# Patient Record
Sex: Female | Born: 1995 | Race: White | Hispanic: No | Marital: Single | Smoking: Never smoker
Health system: Southern US, Community
[De-identification: ages and names within clinical notes are randomized; demographics above are authoritative.]

## PROBLEM LIST (undated history)

## (undated) HISTORY — PX: WISDOM TOOTH EXTRACTION: SHX21

---

## 2016-06-16 ENCOUNTER — Emergency Department (HOSPITAL_BASED_OUTPATIENT_CLINIC_OR_DEPARTMENT_OTHER): Payer: BC Managed Care – PPO

## 2016-06-16 ENCOUNTER — Encounter (HOSPITAL_BASED_OUTPATIENT_CLINIC_OR_DEPARTMENT_OTHER): Payer: Self-pay | Admitting: Emergency Medicine

## 2016-06-16 ENCOUNTER — Emergency Department (HOSPITAL_BASED_OUTPATIENT_CLINIC_OR_DEPARTMENT_OTHER)
Admission: EM | Admit: 2016-06-16 | Discharge: 2016-06-16 | Disposition: A | Discharge status: Home / Self Care | Payer: BC Managed Care – PPO | Attending: Emergency Medicine | Admitting: Emergency Medicine

## 2016-06-16 DIAGNOSIS — J019 Acute sinusitis, unspecified: Secondary | ICD-10-CM | POA: Diagnosis not present

## 2016-06-16 DIAGNOSIS — R51 Headache: Secondary | ICD-10-CM | POA: Diagnosis present

## 2016-06-16 LAB — CBC WITH DIFFERENTIAL/PLATELET
BASOS ABS: 0 10*3/uL (ref 0.0–0.1)
BASOS PCT: 0 %
Eosinophils Absolute: 0 10*3/uL (ref 0.0–0.7)
Eosinophils Relative: 0 %
HEMATOCRIT: 39.2 % (ref 36.0–46.0)
Hemoglobin: 12.9 g/dL (ref 12.0–15.0)
LYMPHS PCT: 7 %
Lymphs Abs: 0.8 10*3/uL (ref 0.7–4.0)
MCH: 29.1 pg (ref 26.0–34.0)
MCHC: 32.9 g/dL (ref 30.0–36.0)
MCV: 88.5 fL (ref 78.0–100.0)
Monocytes Absolute: 0.8 10*3/uL (ref 0.1–1.0)
Monocytes Relative: 7 %
NEUTROS ABS: 10 10*3/uL — AB (ref 1.7–7.7)
Neutrophils Relative %: 86 %
PLATELETS: 397 10*3/uL (ref 150–400)
RBC: 4.43 MIL/uL (ref 3.87–5.11)
RDW: 13.3 % (ref 11.5–15.5)
WBC: 11.6 10*3/uL — AB (ref 4.0–10.5)

## 2016-06-16 LAB — BASIC METABOLIC PANEL
ANION GAP: 11 (ref 5–15)
BUN: 11 mg/dL (ref 6–20)
CALCIUM: 9 mg/dL (ref 8.9–10.3)
CO2: 24 mmol/L (ref 22–32)
Chloride: 101 mmol/L (ref 101–111)
Creatinine, Ser: 0.75 mg/dL (ref 0.44–1.00)
GLUCOSE: 110 mg/dL — AB (ref 65–99)
POTASSIUM: 4 mmol/L (ref 3.5–5.1)
Sodium: 136 mmol/L (ref 135–145)

## 2016-06-16 LAB — URINALYSIS, ROUTINE W REFLEX MICROSCOPIC
Bilirubin Urine: NEGATIVE
Glucose, UA: NEGATIVE mg/dL
Ketones, ur: NEGATIVE mg/dL
NITRITE: NEGATIVE
PROTEIN: NEGATIVE mg/dL
SPECIFIC GRAVITY, URINE: 1.019 (ref 1.005–1.030)
pH: 6.5 (ref 5.0–8.0)

## 2016-06-16 LAB — URINE MICROSCOPIC-ADD ON

## 2016-06-16 LAB — PREGNANCY, URINE: PREG TEST UR: NEGATIVE

## 2016-06-16 MED ORDER — DEXAMETHASONE SODIUM PHOSPHATE 10 MG/ML IJ SOLN
10.0000 mg | Freq: Once | INTRAMUSCULAR | Status: AC
  Administered 2016-06-16: 10 mg via INTRAVENOUS
  Filled 2016-06-16: qty 1

## 2016-06-16 MED ORDER — ACETAMINOPHEN 500 MG PO TABS: 1000.0000 mg | ORAL_TABLET | Freq: Once | ORAL | Status: DC

## 2016-06-16 MED ORDER — DEXAMETHASONE SODIUM PHOSPHATE 10 MG/ML IJ SOLN: 10.0000 mg | Freq: Once | INTRAMUSCULAR | Status: DC

## 2016-06-16 MED ORDER — ACETAMINOPHEN 325 MG PO TABS
650.0000 mg | ORAL_TABLET | Freq: Once | ORAL | Status: AC
  Administered 2016-06-16: 650 mg via ORAL
  Filled 2016-06-16: qty 2

## 2016-06-16 MED ORDER — SODIUM CHLORIDE 0.9 % IV BOLUS (SEPSIS)
1000.0000 mL | Freq: Once | INTRAVENOUS | Status: AC
  Administered 2016-06-16: 1000 mL via INTRAVENOUS

## 2016-06-16 MED ORDER — AMOXICILLIN-POT CLAVULANATE 875-125 MG PO TABS
1.0000 | ORAL_TABLET | Freq: Once | ORAL | Status: AC
  Administered 2016-06-16: 1 via ORAL
  Filled 2016-06-16: qty 1

## 2016-06-16 MED ORDER — KETOROLAC TROMETHAMINE 30 MG/ML IJ SOLN
30.0000 mg | Freq: Once | INTRAMUSCULAR | Status: AC
  Administered 2016-06-16: 30 mg via INTRAVENOUS
  Filled 2016-06-16: qty 1

## 2016-06-16 MED ORDER — OXYMETAZOLINE HCL 0.05 % NA SOLN: 1.0000 | Freq: Two times a day (BID) | NASAL | Status: DC

## 2016-06-16 MED ORDER — AMOXICILLIN-POT CLAVULANATE 875-125 MG PO TABS: 1.0000 | ORAL_TABLET | Freq: Two times a day (BID) | ORAL | Status: DC

## 2016-06-16 NOTE — ED Provider Notes (Signed)
CSN: [id number]     Arrival date & time 06/16/16  1328 History   First MD Initiated Contact with Patient 06/16/16 1354     Chief Complaint  Patient presents with  . Headache     (Consider location/radiation/quality/duration/timing/severity/associated sxs/prior Treatment) HPI   Patient is an 20 year old female with no pertinent past medical history presents the ED with complaint of headache, onset 2 weeks. Patient reports having an intermittent dull aching pain to her right temporal region for the past 2 weeks with intermittent sharp throbbing pain. She states she has been taking 800 mg ibuprofen at home with relief. She notes yesterday after returning home from a petting zoo proximally 5 hours later she began having a sharp intense throbbing headache to the right temporal region. Denies any aggravating or alleviating factors. Denies relief with ibuprofen or Excedrin at home. Endorses associated redness swelling and watering of the right eye, nasal congestion and photophobia. Patient reports history of migraines but notes her headache over the past 2 weeks is different from her typical migraines. Pt denies neck stiffness, visual changes, abdominal pain, N/V, urinary symptoms, numbness, tingling, weakness, seizures, syncope.  Patient reports she was seen in urgent care prior to arrival was reported having a fever of 100.3 and advised to come to the ED for further evaluation.  History reviewed. No pertinent past medical history. History reviewed. No pertinent past surgical history. History reviewed. No pertinent family history. Social History  Substance Use Topics  . Smoking status: Never Smoker   . Smokeless tobacco: None  . Alcohol Use: No   OB History    No data available     Review of Systems  Constitutional: Positive for fever.  HENT: Positive for congestion.   Eyes: Positive for photophobia and pain (right).  Neurological: Positive for headaches.  All other systems reviewed and are  negative.     Allergies  Review of patient's allergies indicates no known allergies.  Home Medications   Prior to Admission medications   Medication Sig Start Date End Date Taking? Authorizing Provider  amoxicillin-clavulanate (AUGMENTIN) 875-125 MG tablet Take 1 tablet by mouth every 12 (twelve) hours. 06/16/16   Barrett Henle, PA-C  oxymetazoline (AFRIN NASAL SPRAY) 0.05 % nasal spray Place 1 spray into both nostrils 2 (two) times daily. 06/16/16   Satira Sark Nadeau, PA-C   BP 116/59 mmHg  Pulse 89  Temp(Src) 102.7 F (39.3 C) (Oral)  Resp 20  Ht  (1.702 m)  Wt 53.524 kg  BMI 18.48 kg/m2  SpO2 100%  LMP 05/26/2016 Physical Exam  Constitutional: She is oriented to person, place, and time. She appears well-developed and well-nourished. No distress.  HENT:  Head: Normocephalic and atraumatic.  Right Ear: Tympanic membrane normal.  Left Ear: Tympanic membrane normal.  Nose: Rhinorrhea (right nare) present. Right sinus exhibits no maxillary sinus tenderness and no frontal sinus tenderness. Left sinus exhibits no maxillary sinus tenderness and no frontal sinus tenderness.  Mouth/Throat: Uvula is midline, oropharynx is clear and moist and mucous membranes are normal. No oropharyngeal exudate, posterior oropharyngeal edema, posterior oropharyngeal erythema or tonsillar abscesses.  Eyes: Conjunctivae and EOM are normal. Pupils are equal, round, and reactive to light. Right eye exhibits no discharge. Left eye exhibits no discharge. No scleral icterus.  Watering noted to right eye  Neck: Normal range of motion. Neck supple. No Kernig's sign noted.  Cardiovascular: Normal rate, regular rhythm, normal heart sounds and intact distal pulses.   Pulmonary/Chest: Effort normal and breath  sounds normal. No respiratory distress. She has no wheezes. She has no rales. She exhibits no tenderness.  Abdominal: Soft. Bowel sounds are normal. She exhibits no distension and no mass.  There is no tenderness. There is no rebound and no guarding.  Musculoskeletal: Normal range of motion. She exhibits no edema.  Lymphadenopathy:    She has no cervical adenopathy.  Neurological: She is alert and oriented to person, place, and time. She has normal strength. No cranial nerve deficit or sensory deficit. Coordination normal.  Skin: Skin is warm and dry. She is not diaphoretic.  Nursing note and vitals reviewed.   ED Course  Procedures (including critical care time) Labs Review Labs Reviewed  URINALYSIS, ROUTINE W REFLEX MICROSCOPIC (NOT AT [facility]) - Abnormal; Notable for the following:    APPearance CLOUDY (*)    Hgb urine dipstick SMALL (*)    Leukocytes, UA TRACE (*)    All other components within normal limits  CBC WITH DIFFERENTIAL/PLATELET - Abnormal; Notable for the following:    WBC 11.6 (*)    Neutro Abs 10.0 (*)    All other components within normal limits  BASIC METABOLIC PANEL - Abnormal; Notable for the following:    Glucose, Bld 110 (*)    All other components within normal limits  URINE MICROSCOPIC-ADD ON - Abnormal; Notable for the following:    Squamous Epithelial / LPF 6-30 (*)    Bacteria, UA MANY (*)    All other components within normal limits  PREGNANCY, URINE    Imaging Review Ct Head Wo Contrast  06/16/2016  CLINICAL DATA:  Headache.  Visual disturbance.  Eye swelling. EXAM: CT HEAD WITHOUT CONTRAST TECHNIQUE: Contiguous axial images were obtained from the base of the skull through the vertex without intravenous contrast. COMPARISON:  None. FINDINGS: No acute intracranial hemorrhage. No focal mass lesion. No CT evidence of acute infarction. No midline shift or mass effect. No hydrocephalus. Basilar cisterns are patent. There is complete opacification of the sphenoid sinus and partial opacification of the ethmoid air cells with complete opacification the posterior air cells while the antral air cells are patent. Frontal sinuses clear. Mild mucosal  thickening in the RIGHT maxillary sinus. IMPRESSION: 1. No acute intracranial findings. 2. Marked inflammation of the ethmoid and sphenoid sinuses. Electronically Signed   By: Genevive BiStewart  Edmunds M.D.   On: 06/16/2016 15:30   I have personally reviewed and evaluated these images and lab results as part of my medical decision-making.   EKG Interpretation None      MDM   Final diagnoses:  Acute sinusitis, recurrence not specified, unspecified location    Patient presents with headache with associated watering eyes, nasal congestion, photophobia and fever. Patient reports having waxing and waning headache over the past 2 weeks. Temp 100, remaining vital stable. Exam revealed rhinorrhea noted to the right knee air, mild watering of the right eye, no sinus tenderness, no neuro deficits, no meningeal signs. Remaining exam unremarkable. Patient given IV fluids and Tylenol. Patient placed on 2 L O2 via [place]. CBC 11.6. Remaining labs and urine unremarkable. Pregnancy negative. On reevaluation patient reports her headache has mildly improved. I was later notified by nurse that patient is complaining of slight worsening of her headache. Patient given IV Toradol and Decadron. CT head revealed marked inflammation of the ethmoid and sphenoid sinuses. I suspect patient's symptoms are likely due to bacterial sinusitis and do not suspect meningitis at this time. Patient given an initial dose of antibiotics in the  ED. Discussed results with patient and mother. On reevaluation patient reports her symptoms have significantly improved. Patient denies any neuro symptoms. Patient able to tolerate by mouth in the ED. Plan to discharge patient home with antibiotics, decongestion and advise to continue taking Tylenol and ibuprofen for her fever and headache. Advised patient to follow up with her PCP within the next 48 hours. Discussed return precautions with patient.    Satira Sarkicole Elizabeth New HolsteinNadeau, [place] JerseyPA-C 06/16/16 1721  Rolan BuccoMelanie  Belfi, MD 07/04/16 1105

## 2016-06-16 NOTE — ED Notes (Signed)
PA at bedside.

## 2016-06-16 NOTE — Discharge Instructions (Signed)
Take your medications as prescribed. I recommend continuing to take Tylenol and ibuprofen as prescribed over-the-counter for fever and headache, alternating between doses every 3 hours. You may also use nasal flushes and a humidifier.  Follow-up with your primary care provider in the next 48 hours if her symptoms have not improved. Please return to the Emergency Department if symptoms worsen or new onset of worsening fever, new/worsening headache, visual changes, lightheadedness, dizziness, neck stiffness, vomiting, difficulty breathing.

## 2016-06-16 NOTE — ED Notes (Addendum)
Pt reports headache to R temporal region intermittent x 2 weeks. Pt states the pain is normally managed by 4 ibuprofen but always comes back. Today, headache is persistent and pt states she cannot get relief. Pt also denies fever or visual disturbances. Pt states she began having congestion, eye swelling and malaise today. Pt denies SOB. Pt states she started taking new birth control pills 1 month ago.

## 2016-06-16 NOTE — ED Notes (Signed)
Pt and mother given d/c instructions as per chart. Rx x 2. Verbalizes understanding. No questions. 

## 2016-06-16 NOTE — ED Notes (Signed)
Patient reports that she has had Headache x 2 weeks. Reports that she thought this was part of a new Birth Control Pills.

## 2016-12-24 ENCOUNTER — Other Ambulatory Visit: Payer: Self-pay | Admitting: Otolaryngology

## 2016-12-24 DIAGNOSIS — J3489 Other specified disorders of nose and nasal sinuses: Secondary | ICD-10-CM

## 2016-12-27 ENCOUNTER — Ambulatory Visit (HOSPITAL_COMMUNITY)
Admission: RE | Admit: 2016-12-27 | Discharge: 2016-12-27 | Disposition: A | Discharge status: Home / Self Care | Payer: BC Managed Care – PPO | Source: Ambulatory Visit | Attending: Otolaryngology | Admitting: Otolaryngology

## 2016-12-27 DIAGNOSIS — J3489 Other specified disorders of nose and nasal sinuses: Secondary | ICD-10-CM | POA: Insufficient documentation

## 2016-12-27 MED ORDER — IOPAMIDOL (ISOVUE-300) INJECTION 61%
INTRAVENOUS | Status: AC
  Administered 2016-12-27: 75 mL via INTRAVENOUS
  Filled 2016-12-27: qty 75

## 2018-05-28 DIAGNOSIS — Z975 Presence of (intrauterine) contraceptive device: Secondary | ICD-10-CM | POA: Insufficient documentation

## 2018-08-10 IMAGING — CT CT MAXILLOFACIAL W/ CM
3 series · 16 of 47 positions shown, 19 images · IV contrast (agent unspecified)
Comparison: Head CT 06/16/2016

CLINICAL DATA: Persistent drainage and headache. Question lesion or
mass of the paranasal sinuses.

EXAM:
CT MAXILLOFACIAL WITH CONTRAST
TECHNIQUE: Multidetector CT imaging of the maxillofacial structures was
performed. Multiplanar CT image reconstructions were also generated.
A small metallic BB was placed on the right temple in order to
reliably differentiate right from left.

[Series 3: max soft · axial · 0.32mm/px · z∈[+1592,+1728]mm · 10 of 80 slices shown, 13 images]
[im 6/80  brain]
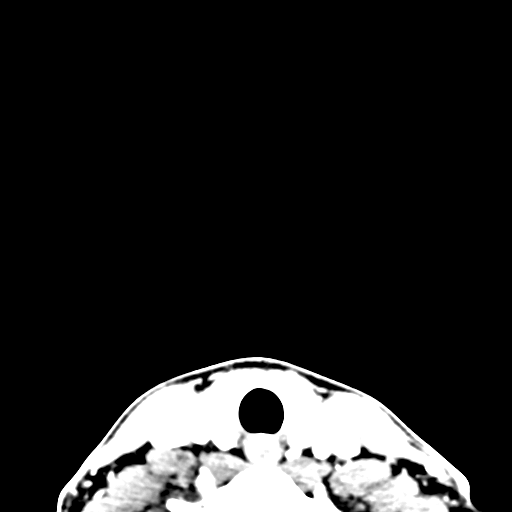
[im 6/80  bone]
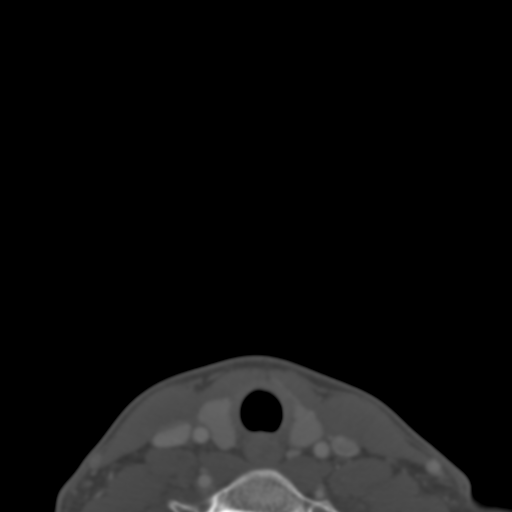
[im 14/80  bone]
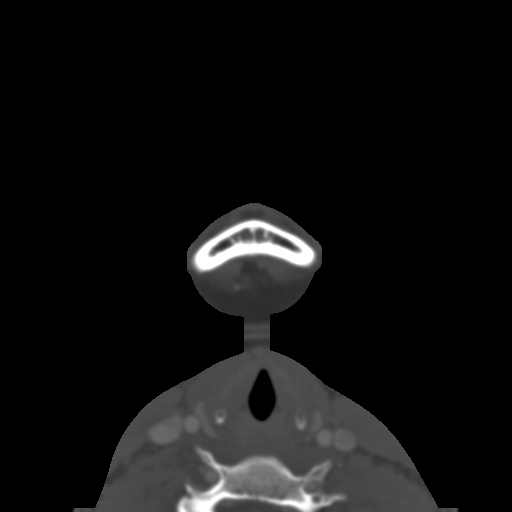
[im 22/80  bone]
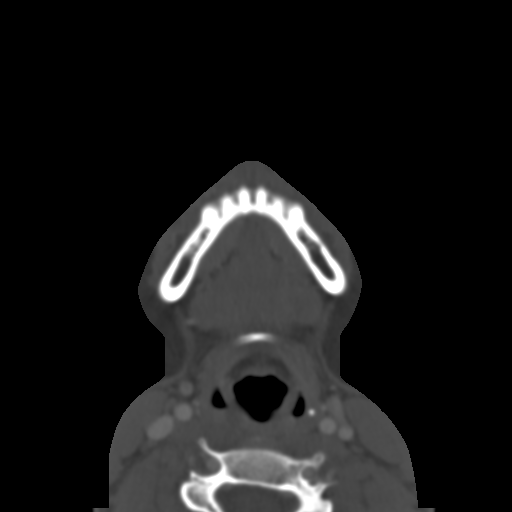
[im 28/80  bone]
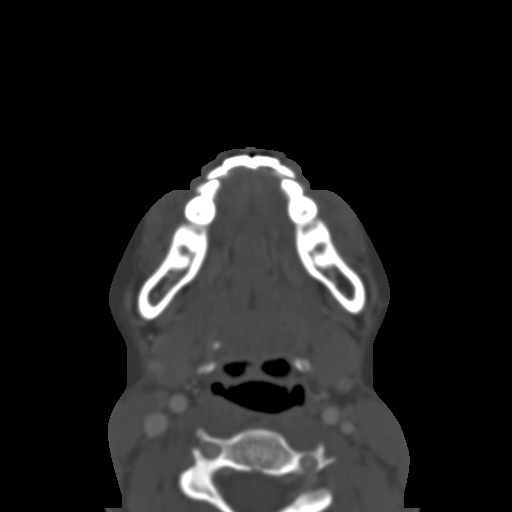
[im 36/80  brain]
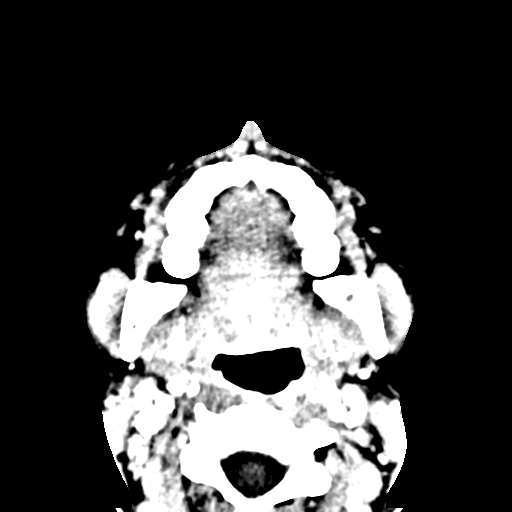
[im 36/80  bone]
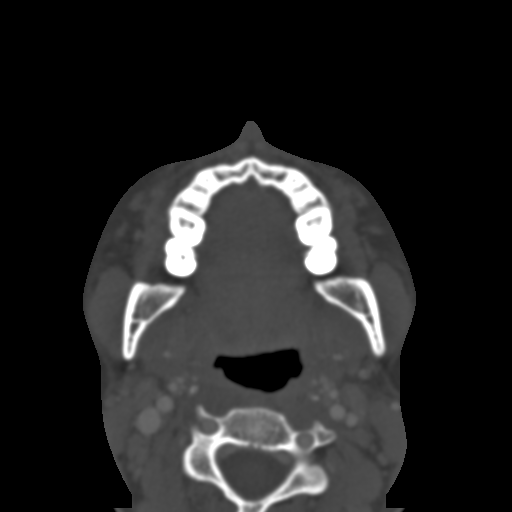
[im 44/80  bone]
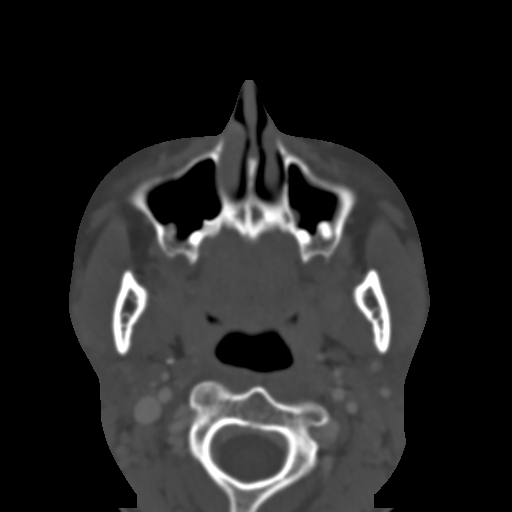
[im 52/80  bone]
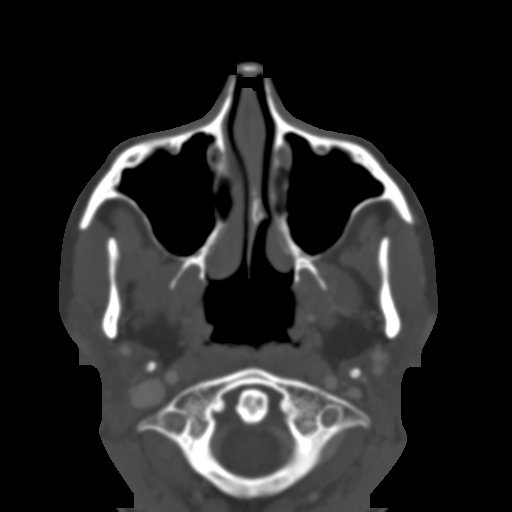
[im 60/80  bone]
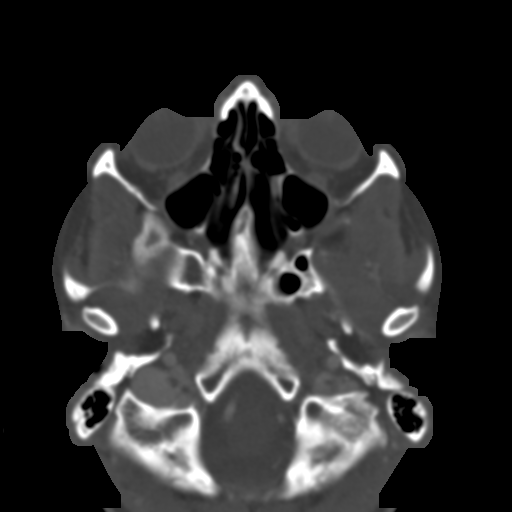
[im 66/80  brain]
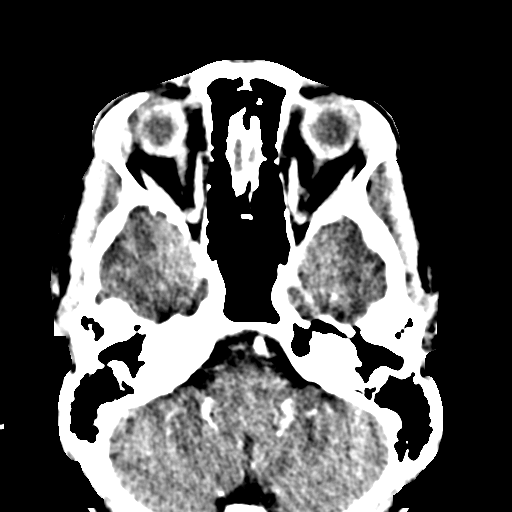
[im 66/80  bone]
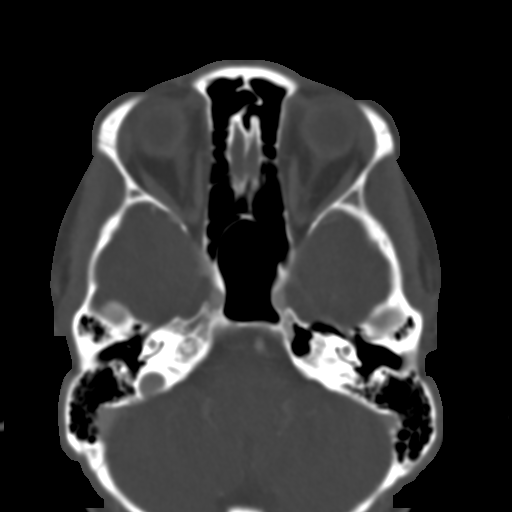
[im 74/80  bone]
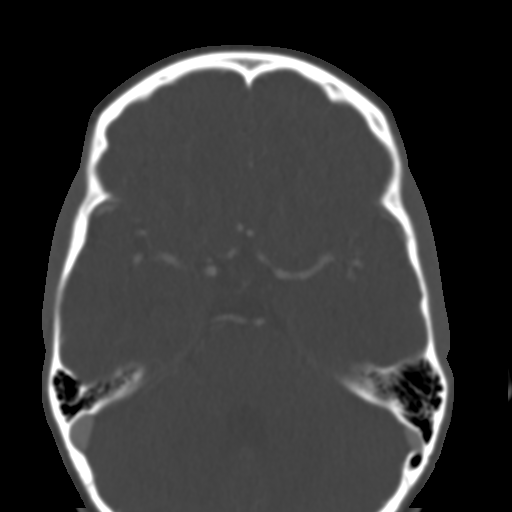

[Series 7: coronal soft · coronal · 0.32mm/px · 3 of 80 slices shown]
[im 27/80  bone]
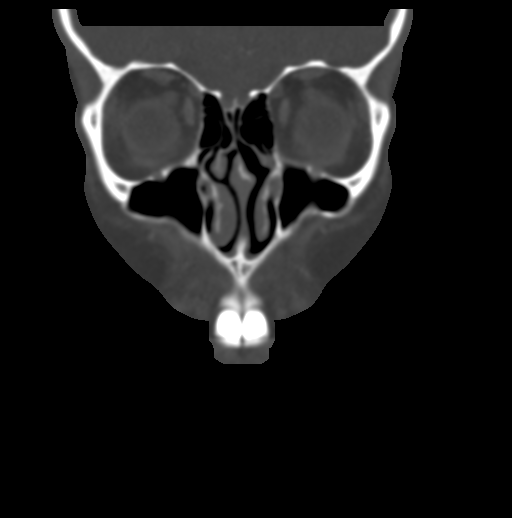
[im 36/80  bone]
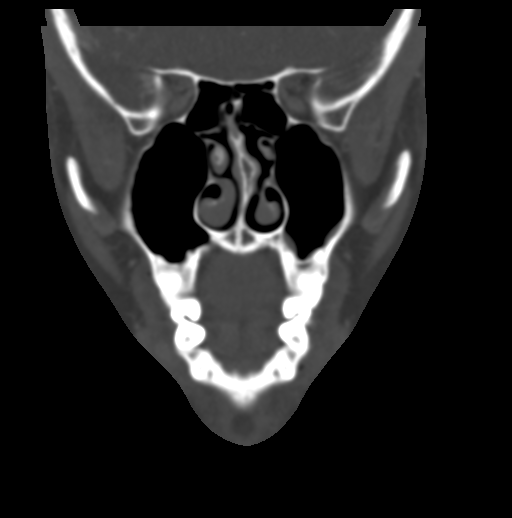
[im 44/80  bone]
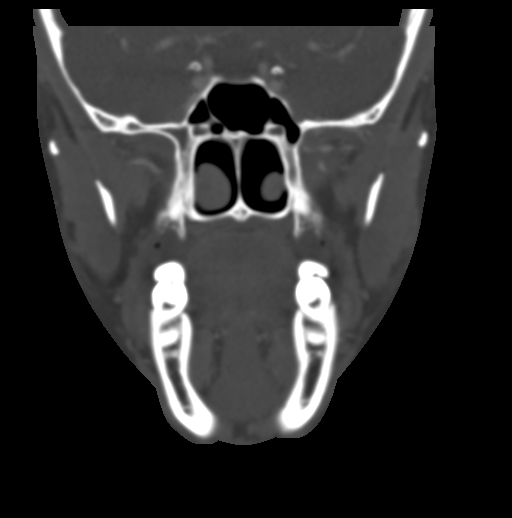

[Series 8: sagittal soft · sagittal · 0.32mm/px · 3 of 74 slices shown]
[im 25/74  bone]
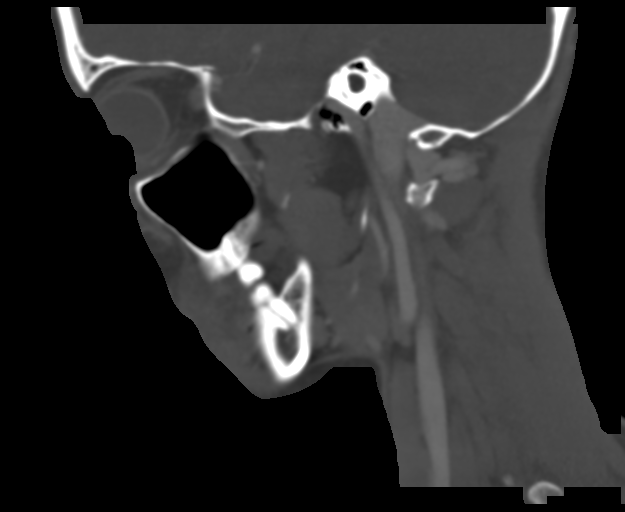
[im 37/74  bone]
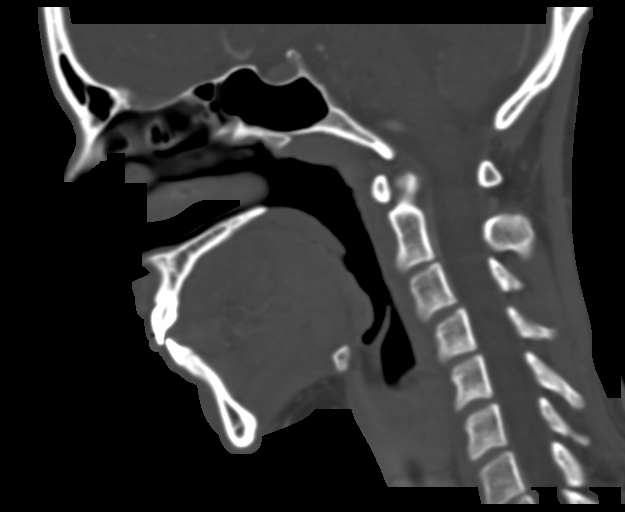
[im 49/74  bone]
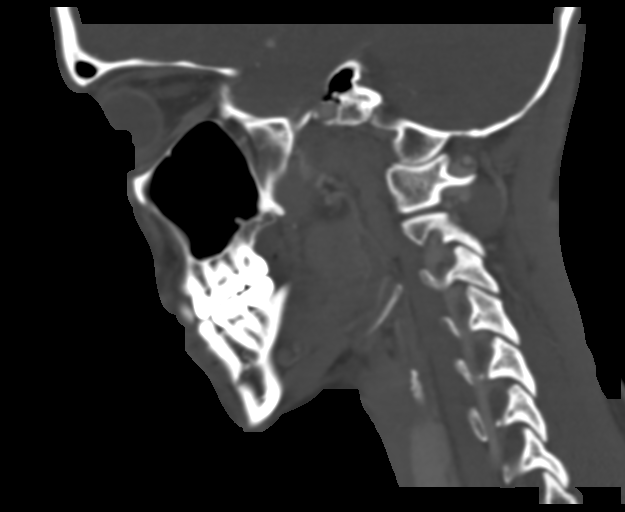

[16 of 47 positions shown; findings below may reference images not displayed]

FINDINGS: Osseous: No fracture deformity or destructive process.

Orbits: No mass or inflammation.

Sinuses: Ethmoid and sphenoid sinusitis seen previously is
completely resolved. Minimal mucosal thickening in the floor of the
left maxillary sinus, which may reflect small retention cysts.

Left maxillary infundibular stenosis due to uncinate morphology. No
infraorbital air cell. Clear sphenoid ethmoidal and frontal
ethmoidal recesses. Mild leftward nasal septal deviation.

Soft tissues: Negative for mass or inflammation.

Limited intracranial: Negative
IMPRESSION: 1. No visualized mass.
2. Resolved sphenoid and ethmoid sinusitis seen May 2016.

## 2019-09-13 ENCOUNTER — Encounter: Payer: Self-pay | Admitting: Rheumatology

## 2019-11-29 HISTORY — PX: WRIST FRACTURE SURGERY: SHX121

## 2019-12-01 NOTE — Progress Notes (Signed)
Virtual Visit via Video Note  I connected with Shannon Edwards on 12/03/19 at  8:15 AM EST by a video enabled telemedicine application and verified that I am speaking with the correct person using two identifiers.  Location: Patient: Home  Provider: Clinic  This service was conducted via virtual visit.  Both audio and visual tools were used.  The patient was located at home. I was located in my office.  Consent was obtained prior to the virtual visit and is aware of possible charges through their insurance for this visit.  The patient is an established patient.  Dr. Corliss Skains, MD conducted the virtual visit and Sherron Ales, PA-C acted as scribe during the service.  Office staff helped with scheduling follow up visits after the service was conducted.     I discussed the limitations of evaluation and management by telemedicine and the availability of in person appointments. The patient expressed understanding and agreed to proceed.  CC: History of Present Illness: Patient is a 23 year old female seen in consultation per request of her PCP.  According to patient recently her Shannon Edwards was diagnosed with systemic lupus erythematosus.  As patient was having some symptoms she got tested and her ANA came positive.  She states she has had photosensitivity since she was in the elementary school.  She states she gets rash nausea and vomiting when she is exposed to the sun for a long time.  She has seen dermatologist in the past and never had a skin biopsy.  She states she has had headaches since she was in high school and had a CT scan of her brain.  She was diagnosed with sinusitis and allergies.  She takes allergy medications.  She is also allergic to gluten.  She tries to avoid gluten.  She states allergy testing also showed multiple other allergies including mold.  She gives history of eczema and seborrheic dermatitis.  She states she also has rosacea.  He denies any history of joint pain or joint swelling.   There is no history of oral ulcers, nasal ulcers, malar rash, Raynaud's phenomenon.  She states she recently was involved in a snowboarding accident where she acquired right radius fracture and she has a metal plate now.  She is recovering from that gradually.  Review of Systems  Constitutional: Negative for fever and malaise/fatigue.  Eyes: Negative for photophobia, pain, discharge and redness.  Respiratory: Negative for cough, shortness of breath and wheezing.   Cardiovascular: Negative for chest pain and palpitations.  Gastrointestinal: Negative for blood in stool, constipation and diarrhea.  Genitourinary: Negative for dysuria.  Musculoskeletal: Negative for back pain, joint pain, myalgias and neck pain.       Denies morning stiffness   Skin: Negative for rash.       +Photosensitivity  Denies symptoms of Raynaud's  Neurological: Negative for dizziness and headaches.  Psychiatric/Behavioral: Positive for depression. The patient is nervous/anxious and has insomnia.       Observations/Objective: Physical Exam  Constitutional: She is oriented to person, place, and time and well-developed, well-nourished, and in no distress.  HENT:  Head: Normocephalic and atraumatic.  Eyes: Conjunctivae are normal.  Pulmonary/Chest: Effort normal.  Neurological: She is alert and oriented to person, place, and time.  Psychiatric: Mood, memory, affect and judgment normal.   Patient reports morning stiffness for 0  minutes.   Patient denies nocturnal pain.  Difficulty dressing/grooming: Denies Difficulty climbing stairs: Denies Difficulty getting out of chair: Denies Difficulty using hands for  taps, buttons, cutlery, and/or writing: Denies   Assessment and Plan: Diagnoses and all orders for this visit:  Positive ANA (antinuclear antibody)- Patient was tested for ANA and the test came positive for patient.  She gives history of photosensitivity, allergies and rosacea.  She gives history of  intermittent rash.  She also showed her some pictures on iPhone on her back with 2 small areas of rhythmic.  Her Shannon Edwards has been tested for lupus.  She is concerned that she may have lupus.  We will obtain[facility] labs today.  She will also need CBC, CMP with GFR, UA, sed rate, CK, lupus anticoagulant which are not part of [facility] labs. Comments: ANA+, RF<10  Family history of lupus erythematosus- Patient states that her Shannon Edwards was diagnosed with lupus but no treatment was started at this point. Comments: Shannon Edwards  Anxiety and depression- Patient has longstanding history of anxiety and depression.  She states she was recently switched from Zoloft to Lexapro.  She has been under a lot of stress.  History of allergic rhinitis- She is on Allegra.  Hx of fracture of radius Comments: Right-Snowboarding injury  Seborrheic dermatitis- Patient has seen dermatologist and has been diagnosed with seborrheic dermatitis and rosacea.    Follow Up Instructions: She will follow up in    I discussed the assessment and treatment plan with the patient. The patient was provided an opportunity to ask questions and all were answered. The patient agreed with the plan and demonstrated an understanding of the instructions.   The patient was advised to call back or seek an in-person evaluation if the symptoms worsen or if the condition fails to improve as anticipated.  I provided 45 minutes of non-face-to-face time during this encounter.   Bo Merino, MD

## 2019-12-03 ENCOUNTER — Other Ambulatory Visit: Payer: Self-pay | Admitting: *Deleted

## 2019-12-03 ENCOUNTER — Telehealth (INDEPENDENT_AMBULATORY_CARE_PROVIDER_SITE_OTHER): Payer: BC Managed Care – PPO | Admitting: Rheumatology

## 2019-12-03 ENCOUNTER — Encounter: Payer: Self-pay | Admitting: Rheumatology

## 2019-12-03 ENCOUNTER — Other Ambulatory Visit: Payer: Self-pay

## 2019-12-03 DIAGNOSIS — M2550 Pain in unspecified joint: Secondary | ICD-10-CM

## 2019-12-03 DIAGNOSIS — F329 Major depressive disorder, single episode, unspecified: Secondary | ICD-10-CM | POA: Diagnosis not present

## 2019-12-03 DIAGNOSIS — Z840 Family history of diseases of the skin and subcutaneous tissue: Secondary | ICD-10-CM

## 2019-12-03 DIAGNOSIS — Z8709 Personal history of other diseases of the respiratory system: Secondary | ICD-10-CM

## 2019-12-03 DIAGNOSIS — F419 Anxiety disorder, unspecified: Secondary | ICD-10-CM

## 2019-12-03 DIAGNOSIS — R768 Other specified abnormal immunological findings in serum: Secondary | ICD-10-CM | POA: Diagnosis not present

## 2019-12-03 DIAGNOSIS — F32A Depression, unspecified: Secondary | ICD-10-CM

## 2019-12-03 DIAGNOSIS — Z8781 Personal history of (healed) traumatic fracture: Secondary | ICD-10-CM

## 2019-12-03 DIAGNOSIS — R5383 Other fatigue: Secondary | ICD-10-CM

## 2019-12-03 DIAGNOSIS — L219 Seborrheic dermatitis, unspecified: Secondary | ICD-10-CM

## 2020-01-06 ENCOUNTER — Ambulatory Visit: Payer: BC Managed Care – PPO | Admitting: Rheumatology

## 2020-01-12 NOTE — Progress Notes (Signed)
Office Visit Note  Patient: Shannon Edwards             Date of Birth: 1996-02-27           MRN: [id number]             PCP: Maurice Small, MD Referring: Maurice Small, MD Visit Date: 01/18/2020 Occupation: @GUAROCC @  Subjective:  Discuss lab work  History of Present Illness: Shannon Edwards is a 24 y.o. female with history of positive ANA and fatigue. Patient reports that her symptoms have been stable since her initial visit.  She states she continues to have facial redness but denies any other rashes.  She reports photosensitivity. She denies any hair loss.  She has intermittent symptoms of Raynaud's in her fingers and toes.  She states she recently fractured her right wrist and has been followed by Dr. 21 after surgery.  She denies any other joint pain or joint swelling at this time.  She denies any oral or nasal ulcerations.  She has chronic mouth dryness but no eye dryness.  She denies any chest pain, palpations, or shortness of breath.  She states she is concerned about the positive ANA due to her mother's family history of lupus.  She states her mother was characterized as having low disease activity lupus and she is not on any treatment.   Activities of Daily Living:  Patient reports morning stiffness for 0 minutes.   Patient Denies nocturnal pain.  Difficulty dressing/grooming: Denies Difficulty climbing stairs: Denies Difficulty getting out of chair: Denies Difficulty using hands for taps, buttons, cutlery, and/or writing: Denies  Review of Systems  Constitutional: Negative for fatigue.  HENT: Positive for mouth dryness. Negative for mouth sores and nose dryness.   Eyes: Negative for itching and dryness.  Respiratory: Negative for shortness of breath, wheezing and difficulty breathing.   Cardiovascular: Negative for chest pain and palpitations.  Gastrointestinal: Negative for blood in stool, constipation and diarrhea.  Endocrine: Negative for increased urination.    Genitourinary: Negative for difficulty urinating and painful urination.  Musculoskeletal: Positive for arthralgias, joint pain and joint swelling. Negative for morning stiffness.  Skin: Positive for color change, rash and sensitivity to sunlight. Negative for hair loss.  Allergic/Immunologic: Negative for susceptible to infections.  Neurological: Positive for dizziness and headaches. Negative for numbness and weakness.  Psychiatric/Behavioral: Positive for sleep disturbance. Negative for confusion. The patient is nervous/anxious.     PMFS History:  There are no problems to display for this patient.   History reviewed. No pertinent past medical history.  Family History  Problem Relation Age of Onset  . Lupus Mother   . Healthy Father   . Healthy Brother    Past Surgical History:  Procedure Laterality Date  . WISDOM TOOTH EXTRACTION    . WRIST FRACTURE SURGERY Right 11/29/2019   Social History   Social History Narrative  . Not on file    There is no immunization history on file for this patient.   Objective: Vital Signs: BP 132/81 (BP Location: Right Arm, Patient Position: Sitting, Cuff Size: Normal)   Pulse (!) 103   Resp 11   Ht 5\' 6"  (1.676 m)   Wt 128 lb 12.8 oz (58.4 kg)   BMI 20.79 kg/m    Physical Exam Vitals and nursing note reviewed.  Constitutional:      Appearance: She is well-developed.  HENT:     Head: Normocephalic and atraumatic.  Eyes:     Conjunctiva/sclera: Conjunctivae  normal.  Cardiovascular:     Rate and Rhythm: Normal rate and regular rhythm.     Heart sounds: Normal heart sounds.  Pulmonary:     Effort: Pulmonary effort is normal.     Breath sounds: Normal breath sounds.  Abdominal:     General: Bowel sounds are normal.     Palpations: Abdomen is soft.  Musculoskeletal:     Cervical back: Normal range of motion.  Lymphadenopathy:     Cervical: No cervical adenopathy.  Skin:    General: Skin is warm and dry.     Capillary Refill:  Capillary refill takes 2 to 3 seconds.     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   No digital ulcerations.  No telangiectasias, skin thickening, or nailbed changes.     Neurological:     Mental Status: She is alert and oriented to person, place, and time.  Psychiatric:        Behavior: Behavior normal.      Musculoskeletal Exam: C-spine, thoracic spine, and lumbar spine good ROM.  Shoulder joints, elbow joints, wrist joints, MCPs, PIPs, and DIPs good ROM with no synovitis.  Complete fist formation bilaterally.  Hip joints, knee joints, ankle joints, MTPs, PIPs, and DIPs good ROM with no synovitis.  No warmth or effusion of knee joints.  No tenderness or inflammation of ankle joints.   CDAI Exam: CDAI Score: -- Patient Global: --; Provider Global: -- Swollen: --; Tender: -- Joint Exam 01/18/2020   No joint exam has been documented for this visit   There is currently no information documented on the homunculus. Go to the Rheumatology activity and complete the homunculus joint exam.  Investigation: No additional findings.  Imaging: No results found.  Recent Labs: Lab Results  Component Value Date   WBC 11.6 (H) 06/16/2016   HGB 12.9 06/16/2016   PLT 397 06/16/2016   NA 136 06/16/2016   K 4.0 06/16/2016   CL 101 06/16/2016   CO2 24 06/16/2016   GLUCOSE 110 (H) 06/16/2016   BUN 11 06/16/2016   CREATININE 0.75 06/16/2016   CALCIUM 9.0 06/16/2016   GFRAA >60 06/16/2016   December 21, 2019 AVISE lupus index -2.9, ANA 1:640 Centromere, centromere ab+, rest of ENA negative, Jo 1 -, anticardiolipin negative, beta-2 negative, RF negative, anti-CCP negative, antithyroglobulin negative, antithyroid peroxidase negative, antihistone negative. Speciality Comments: No specialty comments available.  Procedures:  No procedures performed Allergies: Patient has no known allergies.      Assessment / Plan:     Visit Diagnoses: Positive ANA (antinuclear antibody) - AVISE lupus index  -2.9, ANA 1:640 Centromere, Centromere Ab+, rest of work up negative: We reviewed AVISE lab results with the patient today.  All questions were addressed.  We discussed the significance of a positive ANA and positive centromere antibody.  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  Capillary refill 2-3 seconds.  No digital ulcerations or signs of gangrene noted. No nailbed changes, telangiectasias, or skin thickening noted.  No malar rash.  No dysphagia. She has no other clinical features of autoimmune disease at this time.  She has no joint pain, synovitis, oral/nasal ulcerations, severe sicca symptoms, enlarged lymph nodes, chest pain, or shortness of breath.  We discussed symptoms to monitor for.  She will notify us if she develops any new or worsening symptoms.  She will follow up in 6 months and we will recheck labs at that visit.  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  Family history of lupus erythematosus - Mother  Other fatigue: Stable  Hx of fracture of radius - She fractured her radius and had a rod placed on 11/29/19.  She continues to have limited ROM and diffuse inflammation.  She is following up with Dr. Jimmey Ralph in [place], [place].   Other medical conditions are listed as follows:   Seborrheic dermatitis  Anxiety and depression  History of allergic rhinitis  Orders: No orders of the defined types were placed in this encounter.  No orders of the defined types were placed in this encounter.   Face-to-face time spent with patient was 30 minutes. Greater than 50% of time was spent in counseling and coordination of care.  Follow-Up Instructions: Return for Positive ANA.   Gearldine Bienenstock, PA-C   I examined and evaluated the patient with Sherron Ales PA.  Patient has positive ANA significant titer with centromere pattern.  All other autoimmune antibodies were negative.  Her complements were  normal.  There is no evidence of sclerodactyly, telangiectasias or periungual blood.  She has mild Raynaud's symptoms.  We will monitor her over time.  She is moving to [place] in September.  I will evaluate her in August and then on yearly basis.  She has been advised to contact us in case she develops any new symptoms.  She had mild livedo reticularis on examination.  The plan of care was discussed as noted above.  Pollyann Savoy, MD  Note - This record has been created using Animal nutritionist.  Chart creation errors have been sought, but may not always  have been located. Such creation errors do not reflect on  the standard of medical care.

## 2020-01-18 ENCOUNTER — Encounter: Payer: Self-pay | Admitting: Rheumatology

## 2020-01-18 ENCOUNTER — Other Ambulatory Visit: Payer: Self-pay

## 2020-01-18 ENCOUNTER — Ambulatory Visit (INDEPENDENT_AMBULATORY_CARE_PROVIDER_SITE_OTHER): Payer: BC Managed Care – PPO | Admitting: Rheumatology

## 2020-01-18 VITALS — BP 132/81 | HR 103 | Resp 11 | Ht 66.0 in | Wt 128.8 lb

## 2020-01-18 DIAGNOSIS — F329 Major depressive disorder, single episode, unspecified: Secondary | ICD-10-CM

## 2020-01-18 DIAGNOSIS — Z8781 Personal history of (healed) traumatic fracture: Secondary | ICD-10-CM | POA: Diagnosis not present

## 2020-01-18 DIAGNOSIS — Z840 Family history of diseases of the skin and subcutaneous tissue: Secondary | ICD-10-CM | POA: Diagnosis not present

## 2020-01-18 DIAGNOSIS — R231 Pallor: Secondary | ICD-10-CM

## 2020-01-18 DIAGNOSIS — Z8709 Personal history of other diseases of the respiratory system: Secondary | ICD-10-CM

## 2020-01-18 DIAGNOSIS — R768 Other specified abnormal immunological findings in serum: Secondary | ICD-10-CM

## 2020-01-18 DIAGNOSIS — F419 Anxiety disorder, unspecified: Secondary | ICD-10-CM

## 2020-01-18 DIAGNOSIS — F32A Depression, unspecified: Secondary | ICD-10-CM

## 2020-01-18 DIAGNOSIS — L219 Seborrheic dermatitis, unspecified: Secondary | ICD-10-CM

## 2020-01-18 DIAGNOSIS — R5383 Other fatigue: Secondary | ICD-10-CM | POA: Diagnosis not present

## 2020-01-18 DIAGNOSIS — I7300 Raynaud's syndrome without gangrene: Secondary | ICD-10-CM

## 2020-01-18 NOTE — Patient Instructions (Signed)
Raynaud's syndrome

## 2020-07-18 NOTE — Progress Notes (Deleted)
   Office Visit Note  Patient: Shannon Edwards             Date of Birth: [birth year]           MRN: [id number]             PCP: Maurice Small, MD Referring: Maurice Small, MD Visit Date: 08/01/2020 Occupation: @GUAROCC @  Subjective:  No chief complaint on file.   History of Present Illness: Shannon Edwards is a 24 y.o. female ***   Activities of Daily Living:  Patient reports morning stiffness for *** {minute/hour:19697}.   Patient {ACTIONS;DENIES/REPORTS:21021675::"Denies"} nocturnal pain.  Difficulty dressing/grooming: {ACTIONS;DENIES/REPORTS:21021675::"Denies"} Difficulty climbing stairs: {ACTIONS;DENIES/REPORTS:21021675::"Denies"} Difficulty getting out of chair: {ACTIONS;DENIES/REPORTS:21021675::"Denies"} Difficulty using hands for taps, buttons, cutlery, and/or writing: {ACTIONS;DENIES/REPORTS:21021675::"Denies"}  No Rheumatology ROS completed.   PMFS History:  There are no problems to display for this patient.   No past medical history on file.  Family History  Problem Relation Age of Onset  . Lupus Mother   . Healthy Father   . Healthy Brother    Past Surgical History:  Procedure Laterality Date  . WISDOM TOOTH EXTRACTION    . WRIST FRACTURE SURGERY Right 11/29/2019   Social History   Social History Narrative  . Not on file    There is no immunization history on file for this patient.   Objective: Vital Signs: There were no vitals taken for this visit.   Physical Exam   Musculoskeletal Exam: ***  CDAI Exam: CDAI Score: -- Patient Global: --; Provider Global: -- Swollen: --; Tender: -- Joint Exam 08/01/2020   No joint exam has been documented for this visit   There is currently no information documented on the homunculus. Go to the Rheumatology activity and complete the homunculus joint exam.  Investigation: No additional findings.  Imaging: No results found.  Recent Labs: Lab Results  Component Value Date   WBC 11.6 (H) 06/16/2016    HGB 12.9 06/16/2016   PLT 397 06/16/2016   NA 136 06/16/2016   K 4.0 06/16/2016   CL 101 06/16/2016   CO2 24 06/16/2016   GLUCOSE 110 (H) 06/16/2016   BUN 11 06/16/2016   CREATININE 0.75 06/16/2016   CALCIUM 9.0 06/16/2016   GFRAA >60 06/16/2016    Speciality Comments: No specialty comments available.  Procedures:  No procedures performed Allergies: Patient has no known allergies.   Assessment / Plan:     Visit Diagnoses: No diagnosis found.  Orders: No orders of the defined types were placed in this encounter.  No orders of the defined types were placed in this encounter.   Face-to-face time spent with patient was *** minutes. Greater than 50% of time was spent in counseling and coordination of care.  Follow-Up Instructions: No follow-ups on file.   06/18/2016, CMA  Note - This record has been created using Ellen Henri.  Chart creation errors have been sought, but may not always  have been located. Such creation errors do not reflect on  the standard of medical care.

## 2020-08-01 ENCOUNTER — Ambulatory Visit: Payer: BC Managed Care – PPO | Admitting: Rheumatology

## 2021-07-24 ENCOUNTER — Other Ambulatory Visit: Payer: Self-pay

## 2021-07-24 ENCOUNTER — Ambulatory Visit: Payer: BC Managed Care – PPO | Admitting: Podiatry

## 2021-07-24 ENCOUNTER — Encounter: Payer: Self-pay | Admitting: Podiatry

## 2021-07-24 DIAGNOSIS — B070 Plantar wart: Secondary | ICD-10-CM

## 2021-07-24 DIAGNOSIS — F900 Attention-deficit hyperactivity disorder, predominantly inattentive type: Secondary | ICD-10-CM | POA: Insufficient documentation

## 2021-07-24 DIAGNOSIS — F418 Other specified anxiety disorders: Secondary | ICD-10-CM | POA: Insufficient documentation

## 2021-07-24 MED ORDER — FLUOROURACIL 5 % EX CREA: TOPICAL_CREAM | Freq: Two times a day (BID) | CUTANEOUS | 1 refills | Status: AC

## 2021-07-24 NOTE — Progress Notes (Signed)
  Subjective:  Patient ID: Shannon Edwards, female    DOB: 1996/06/19,  MRN: [id number] HPI Chief Complaint  Patient presents with   Foot Pain    Plantar forefoot and plantar hallux right - multiple callused lesions on toe and one large lesion forefoot x 3 months, pain with walking, tried corn pads and removers-no help   New Patient (Initial Visit)    25 y.o. female presents with the above complaint.   ROS: Denies fever chills nausea vomiting muscle aches pains calf pain back pain chest pain shortness of breath.  No past medical history on file. Past Surgical History:  Procedure Laterality Date   WISDOM TOOTH EXTRACTION     WRIST FRACTURE SURGERY Right 11/29/2019    Current Outpatient Medications:    fluorouracil (EFUDEX) 5 % cream, Apply topically 2 (two) times daily., Disp: 40 g, Rfl: 1   levonorgestrel (KYLEENA) 19.5 MG IUD, Kyleena 17.5 mcg/24 hrs (65yrs) 19.5mg  intrauterine device, Disp: , Rfl:    VITAMIN D PO, Take by mouth., Disp: , Rfl:    amphetamine-dextroamphetamine (ADDERALL XR) 10 MG 24 hr capsule, Take 10 mg by mouth daily., Disp: , Rfl:    citalopram (CELEXA) 20 MG tablet, Take 20 mg by mouth daily., Disp: , Rfl:    EPINEPHrine 0.3 mg/0.3 mL IJ SOAJ injection, Auvi-Q 0.3 mg/0.3 mL injection, auto-injector  INJECT AS NEEDED FOR SEVERE ALLERGIC REACTION INCLUDING ANAPHYLAXIS AS DIRECTED, Disp: , Rfl:    levocetirizine (XYZAL ALLERGY 24HR) 5 MG tablet, Xyzal 5 mg tablet  Take 1 tablet every day by oral route., Disp: , Rfl:   No Known Allergies Review of Systems Objective:  There were no vitals filed for this visit.  General: Well developed, nourished, in no acute distress, alert and oriented x3   Dermatological: Skin is warm, dry and supple bilateral. Nails x 10 are well maintained; remaining integument appears unremarkable at this time. There are no open sores, no preulcerative lesions, no rash or signs of infection present.  Verrucoid lesion just proximal to the  metatarsal head central foot measuring 0.7 cm in total diameter with verrucoid characteristics consistent with thrombosed capillaries and skin lines circumventing the lesion.  She also has 2 smaller lesions on the tuft of the fourth toe right foot.  Vascular: Dorsalis Pedis artery and Posterior Tibial artery pedal pulses are 2/4 bilateral with immedate capillary fill time. Pedal hair growth present. No varicosities and no lower extremity edema present bilateral.   Neruologic: Grossly intact via light touch bilateral. Vibratory intact via tuning fork bilateral. Protective threshold with Semmes Wienstein monofilament intact to all pedal sites bilateral. Patellar and Achilles deep tendon reflexes 2+ bilateral. No Babinski or clonus noted bilateral.   Musculoskeletal: No gross boney pedal deformities bilateral. No pain, crepitus, or limitation noted with foot and ankle range of motion bilateral. Muscular strength 5/5 in all groups tested bilateral.  Gait: Unassisted, Nonantalgic.    Radiographs:  None taken  Assessment & Plan:   Assessment: Verrucoid lesions plantar aspect right foot and fourth toe  Plan: Chemical destruction of lesions today and placed her on Efudex cream for the right foot.  We will follow-up with her in 1 month to 6 weeks for reevaluation at which time we can consider numbing these up and excising them if she wants to do that.     Fabien Travelstead T. [place], [place]

## 2021-08-15 ENCOUNTER — Ambulatory Visit: Payer: BC Managed Care – PPO | Admitting: Physician Assistant

## 2021-08-28 ENCOUNTER — Ambulatory Visit: Payer: BC Managed Care – PPO | Admitting: Podiatry

## 2021-09-20 ENCOUNTER — Ambulatory Visit (INDEPENDENT_AMBULATORY_CARE_PROVIDER_SITE_OTHER): Payer: BC Managed Care – PPO | Admitting: Podiatry

## 2021-09-20 ENCOUNTER — Other Ambulatory Visit: Payer: Self-pay

## 2021-09-20 DIAGNOSIS — B070 Plantar wart: Secondary | ICD-10-CM

## 2021-09-21 NOTE — Progress Notes (Signed)
She presents today for follow-up of warts plantar aspect of her right foot.  She states that they turned dark and really are no longer painful.  Objective: Vital signs are stable she is alert and oriented x3.  Warts plantar aspect of the right foot demonstrate scabbed appearances.  These were debrided today and demonstrated no further wart at those sites.  Though there does appear to be a few small warts scattered about and I demonstrated these for her today.  Assessment: Well-healing verruca plantaris right foot.  A few warts are remaining.  Plan: At this point she will continue the application of the Efudex cream to the new warts.  I will follow-up with her in a few weeks for reevaluation

## 2021-10-31 ENCOUNTER — Other Ambulatory Visit: Payer: Self-pay | Admitting: Obstetrics and Gynecology

## 2021-10-31 DIAGNOSIS — N6310 Unspecified lump in the right breast, unspecified quadrant: Secondary | ICD-10-CM

## 2021-11-14 ENCOUNTER — Other Ambulatory Visit: Payer: Self-pay

## 2021-11-14 ENCOUNTER — Ambulatory Visit
Admission: RE | Admit: 2021-11-14 | Discharge: 2021-11-14 | Disposition: A | Discharge status: Home / Self Care | Payer: BC Managed Care – PPO | Source: Ambulatory Visit | Attending: Obstetrics and Gynecology | Admitting: Obstetrics and Gynecology

## 2021-11-14 DIAGNOSIS — N6310 Unspecified lump in the right breast, unspecified quadrant: Secondary | ICD-10-CM

## 2023-06-28 IMAGING — US US BREAST*R* LIMITED INC AXILLA
1 series · 7 of 7 positions shown · non-contrast
Comparison: None.

CLINICAL DATA: Palpable lump in the right breast.

EXAM:
ULTRASOUND OF THE RIGHT BREAST

[Series 1: us breast*right* limited inc axilla · 0.06mm/px · 7 of 7 slices shown]
[im 1/7]
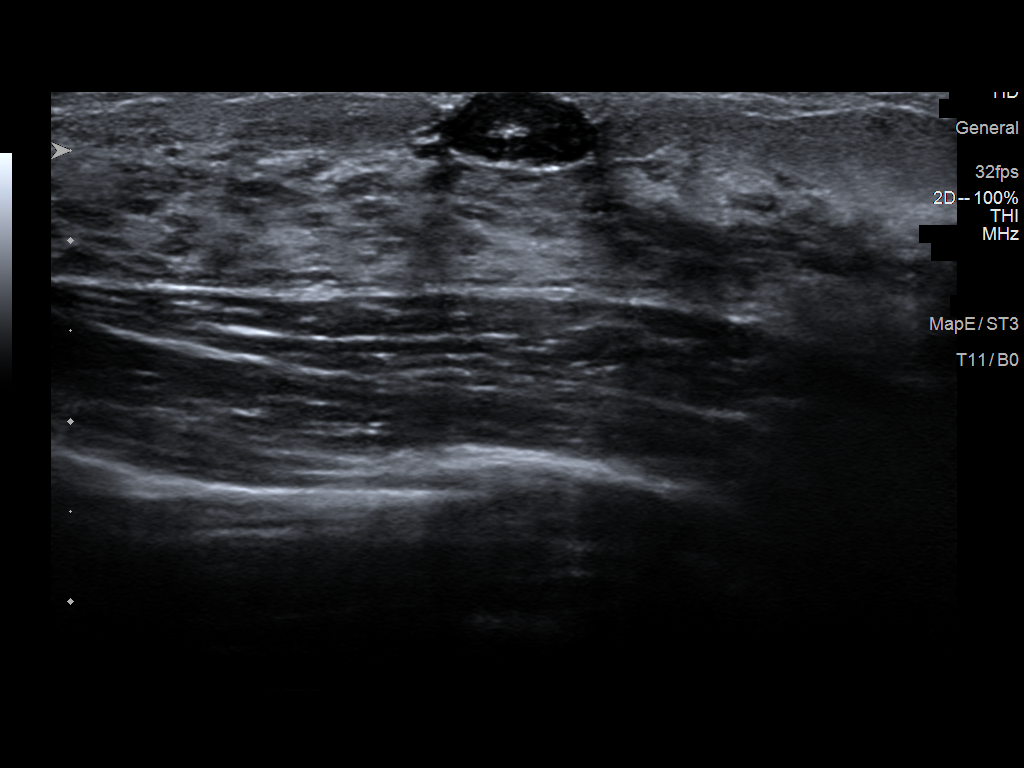
[im 2/7]
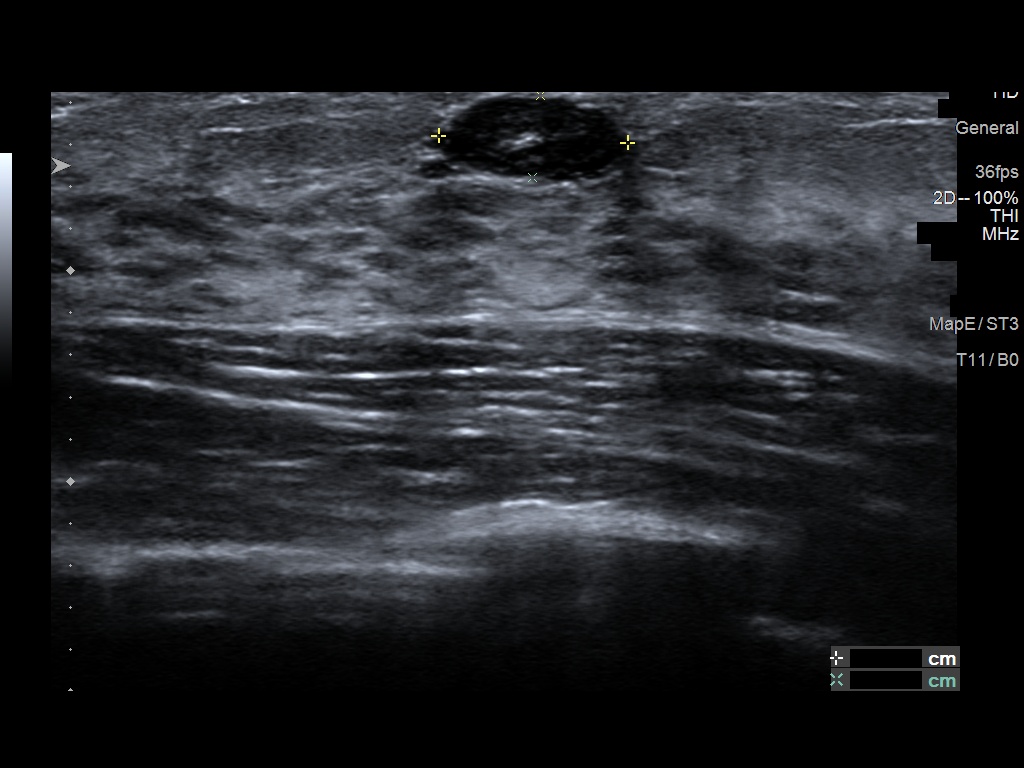
[im 3/7]
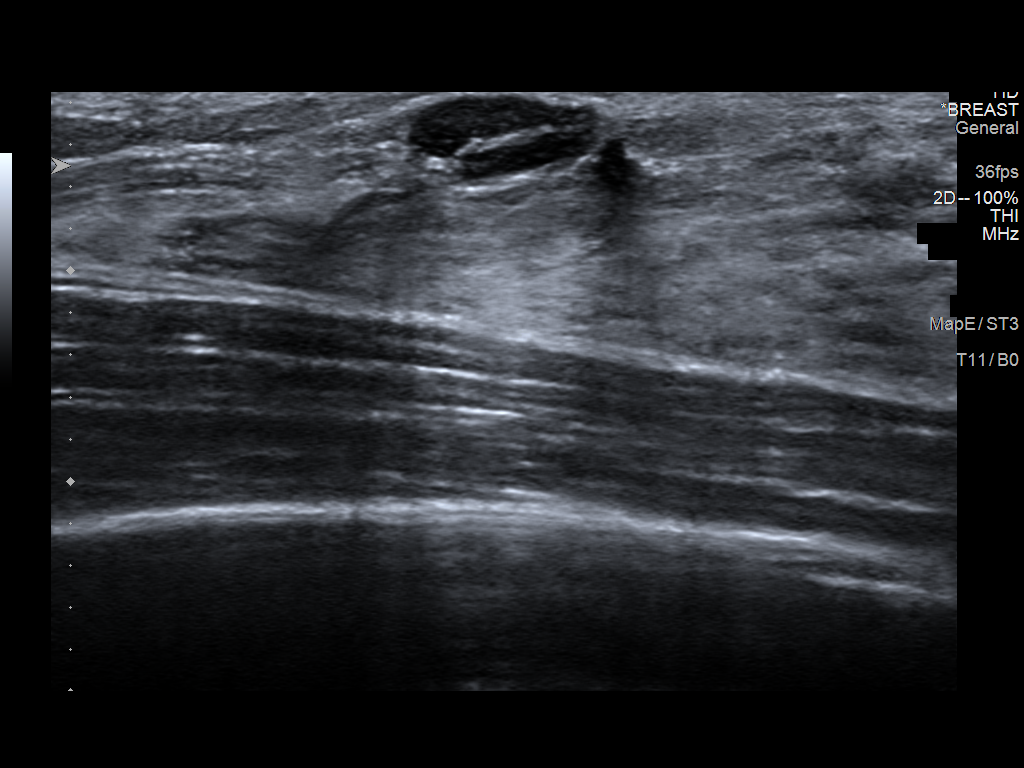
[im 4/7]
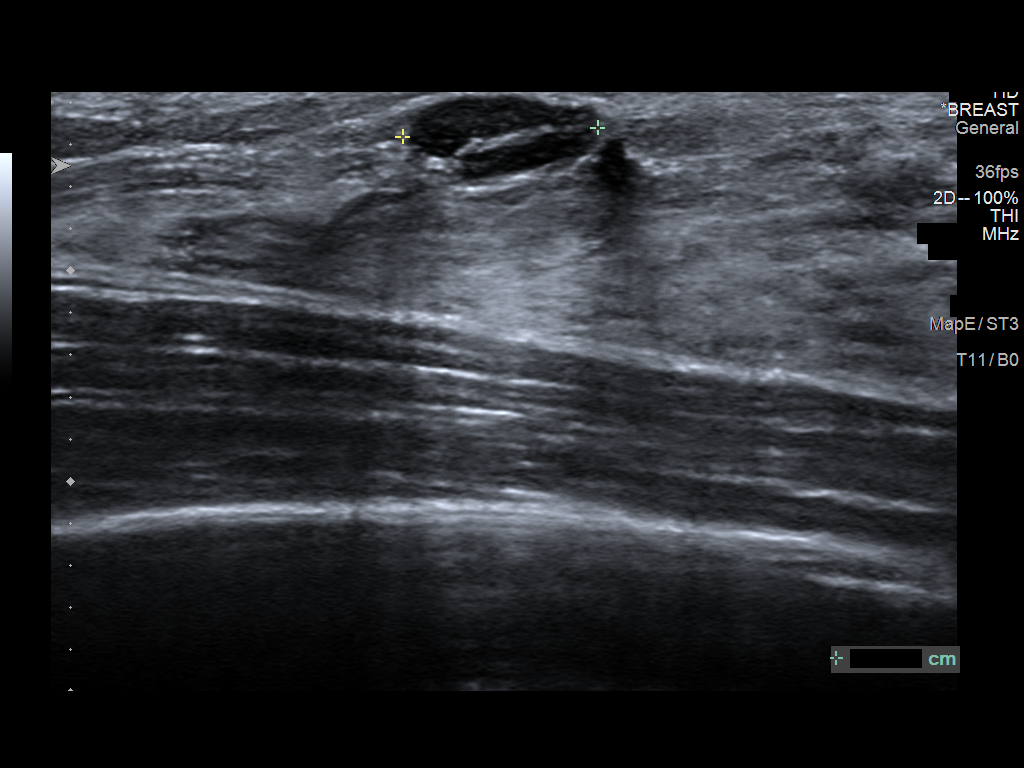
[im 5/7]
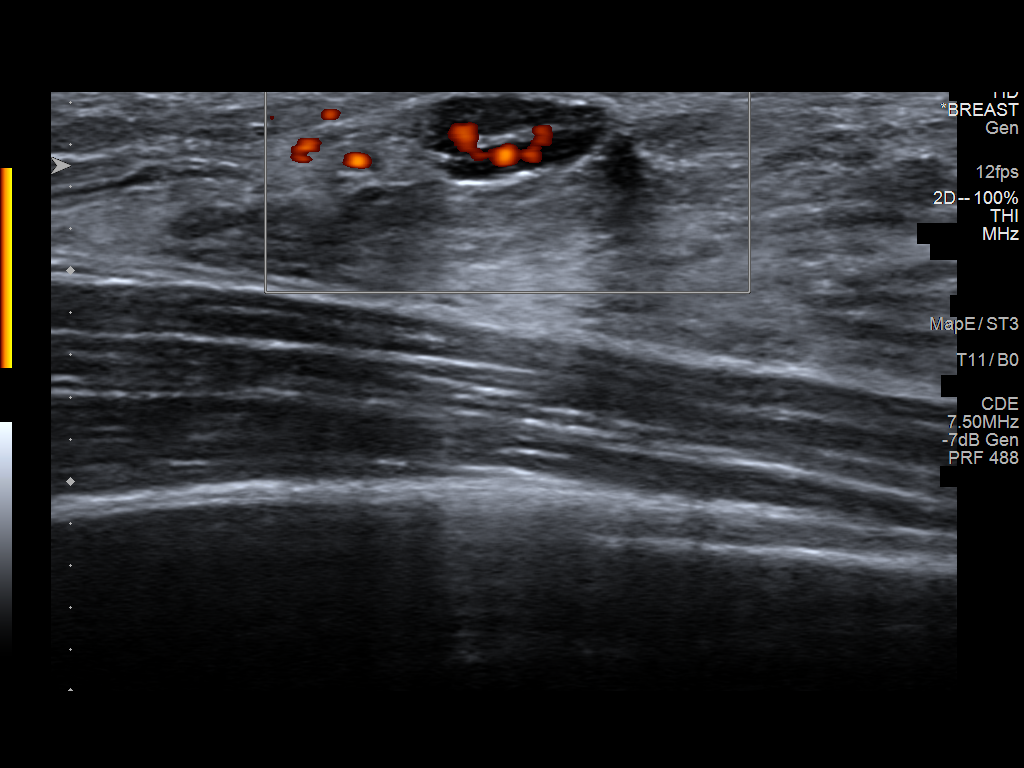
[im 6/7]
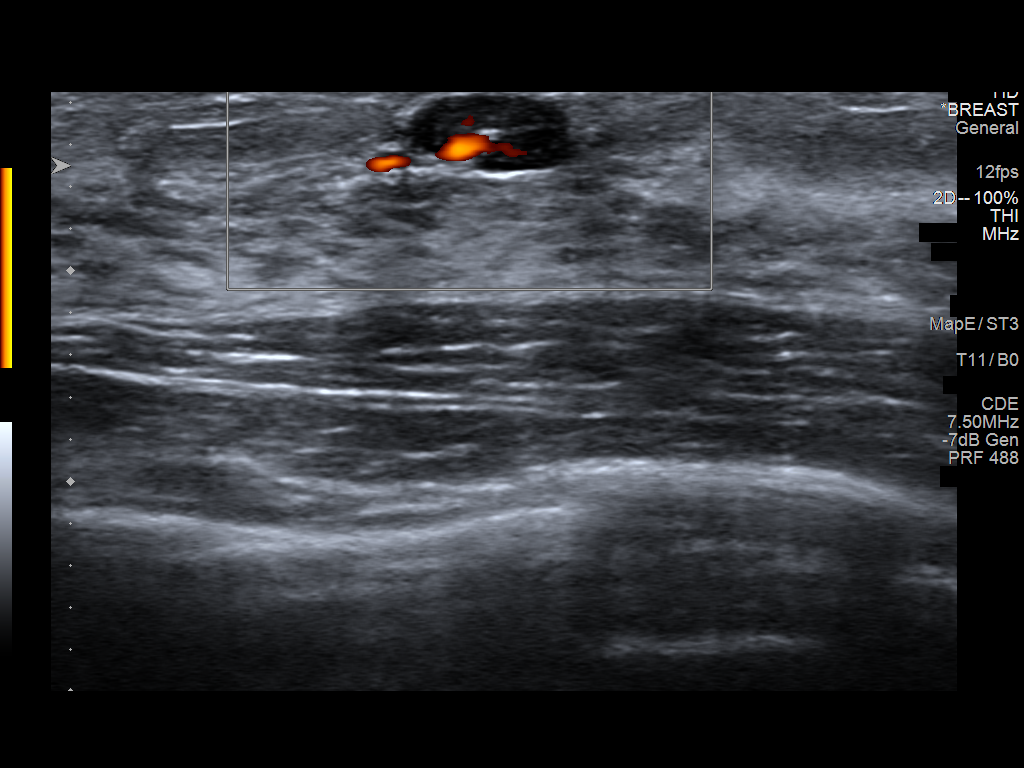
[im 7/7]
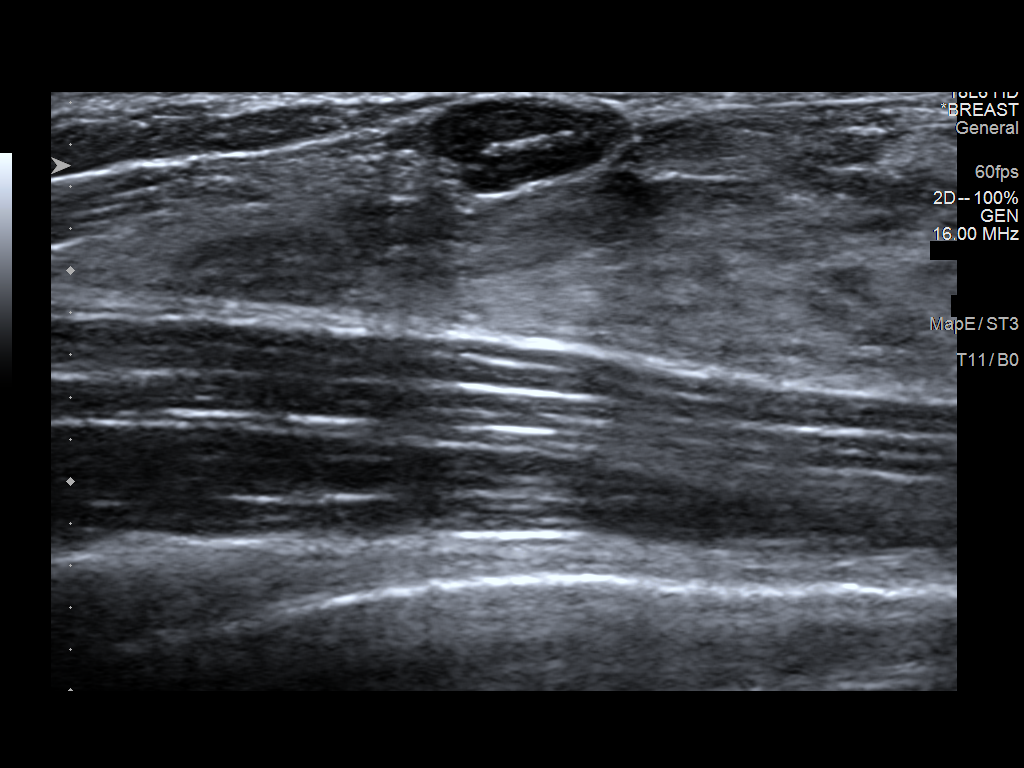

[7 of 7 positions shown; findings below may reference images not displayed]

FINDINGS: On physical exam, there is a small lump at 11 o'clock.

Targeted ultrasound is performed, showing a morphologically normal
right intramammary lymph node with a cortex measuring up to 2 mm.
IMPRESSION: The patient is feeling a morphologically normal intramammary lymph
node.

RECOMMENDATION:
Recommend annual screening mammography beginning at the age 40.

I have discussed the findings and recommendations with the patient.
If applicable, a reminder letter will be sent to the patient
regarding the next appointment.

BI-RADS CATEGORY  2: Benign.
